# Patient Record
Sex: Female | Born: 1960 | Race: Black or African American | Hispanic: No | Marital: Married | Smoking: Current every day smoker
Health system: Southern US, Community
[De-identification: ages and names within clinical notes are randomized; demographics above are authoritative.]

---

## 2016-05-06 ENCOUNTER — Emergency Department (HOSPITAL_COMMUNITY)

## 2016-05-06 ENCOUNTER — Encounter (HOSPITAL_COMMUNITY): Payer: Self-pay | Admitting: Emergency Medicine

## 2016-05-06 ENCOUNTER — Emergency Department (HOSPITAL_COMMUNITY)
Admission: EM | Admit: 2016-05-06 | Discharge: 2016-05-06 | Disposition: A | Discharge status: Home / Self Care | Attending: Emergency Medicine | Admitting: Emergency Medicine

## 2016-05-06 DIAGNOSIS — R072 Precordial pain: Secondary | ICD-10-CM | POA: Diagnosis not present

## 2016-05-06 DIAGNOSIS — R079 Chest pain, unspecified: Secondary | ICD-10-CM

## 2016-05-06 DIAGNOSIS — F17200 Nicotine dependence, unspecified, uncomplicated: Secondary | ICD-10-CM | POA: Insufficient documentation

## 2016-05-06 LAB — BASIC METABOLIC PANEL
ANION GAP: 8 (ref 5–15)
BUN: 10 mg/dL (ref 6–20)
CHLORIDE: 110 mmol/L (ref 101–111)
CO2: 24 mmol/L (ref 22–32)
CREATININE: 0.68 mg/dL (ref 0.44–1.00)
Calcium: 9.4 mg/dL (ref 8.9–10.3)
GFR calc non Af Amer: >60 mL/min (ref >60)
Glucose, Bld: 109 mg/dL — ABNORMAL HIGH (ref 65–99)
POTASSIUM: 3.8 mmol/L (ref 3.5–5.1)
SODIUM: 142 mmol/L (ref 135–145)

## 2016-05-06 LAB — CBC
HEMATOCRIT: 37.4 % (ref 36.0–46.0)
HEMOGLOBIN: 12.3 g/dL (ref 12.0–15.0)
MCH: 28.1 pg (ref 26.0–34.0)
MCHC: 32.9 g/dL (ref 30.0–36.0)
MCV: 85.4 fL (ref 78.0–100.0)
PLATELETS: 256 10*3/uL (ref 150–400)
RBC: 4.38 MIL/uL (ref 3.87–5.11)
RDW: 13.5 % (ref 11.5–15.5)
WBC: 5.6 10*3/uL (ref 4.0–10.5)

## 2016-05-06 LAB — I-STAT TROPONIN, ED
Troponin i, poc: 0 ng/mL (ref 0.00–0.08)
Troponin i, poc: 0 ng/mL (ref 0.00–0.08)

## 2016-05-06 LAB — D-DIMER, QUANTITATIVE (NOT AT ARMC)

## 2016-05-06 NOTE — ED Notes (Signed)
Pt stable, ambulatory, states understanding of discharge instructions 

## 2016-05-06 NOTE — ED Triage Notes (Signed)
Pt sts chest tightness and SOB starting last night

## 2016-05-06 NOTE — ED Provider Notes (Signed)
[facility]-EMERGENCY DEPT Provider Note   CSN: [id number] Arrival date & time: 05/06/16  1601     History   Chief Complaint Chief Complaint  Patient presents with  . Chest Pain    HPI Anne Lopez is a 56 y.o. female.  HPI  Pt sts chest tightness and SOB starting last night   Patient states that she is a healthy runner. She typically spaces chest pain about twice a month and does not think much about it. In the past she's been diagnosed with reflux and has been evaluated in the hospital in the past for such, evaluation has been negative. She recently had a physical and everything was negative. She has no medical conditions. She eats very healthy and exercises daily. In fact, she is training for a 5K and has experienced chest pain at times but is not worse with the exertion. She did note that chest pain started yesterday and was worse with movement. Not with exertion, did radiate slightly to the right arm. It felt sharp. Initially told triage that she felt like the short of breath but she denies this currently. She states she has not been short of breath. Denies history of pulmonary embolism, blood clots, recent immobilization, recent surgery, birth control. Her pain is not pleuritic. No hemoptysis. Denies radiation to the back, hypertension, tearing pain.  No prior records, ekg  History reviewed. No pertinent past medical history.  There are no active problems to display for this patient.   History reviewed. No pertinent surgical history.  OB History    No data available       Home Medications    Prior to Admission medications   Medication Sig Start Date End Date Taking? Authorizing Provider  cholecalciferol (VITAMIN D) 1000 units tablet Take 1,000 Units by mouth daily.   Yes Historical Provider, MD  EPINEPHrine (EPIPEN 2-PAK) 0.3 mg/0.3 mL IJ SOAJ injection Inject 0.3 mg into the muscle once as needed (severe allergic reaction).   Yes Historical Provider, MD  Magnesium 250 MG  TABS Take 500 mg by mouth daily.   Yes Historical Provider, MD  Magnesium Sulfate, Laxative, (EPSOM SALT PO) Take 5 mLs by mouth daily. Mix in juice and drink   Yes Historical Provider, MD  Multiple Vitamins-Minerals (HAIR/SKIN/NAILS/BIOTIN) TABS Take 1 tablet by mouth daily.   Yes Historical Provider, MD  Prenatal Vit-Fe Fumarate-FA (PRENATAL MULTIVITAMIN) TABS tablet Take 1 tablet by mouth daily at 12 noon.   Yes Historical Provider, MD  vitamin C (ASCORBIC ACID) 250 MG tablet Take 500 mg by mouth daily.   Yes Historical Provider, MD    Family History History reviewed. No pertinent family history.  Social History Social History  Substance Use Topics  . Smoking status: Current Every Day Smoker  . Smokeless tobacco: Never Used  . Alcohol use No     Allergies   Penicillins and Shellfish allergy   Review of Systems Review of Systems  Constitutional: Negative for fever.  Allergic/Immunologic: Negative for immunocompromised state.  All other systems reviewed and are negative.    Physical Exam Updated Vital Signs BP 122/81 (BP Location: Right Arm)   Pulse 75   Temp 98.7 F (37.1 C)   Resp 22   SpO2 99%   Physical Exam  Constitutional: She appears well-developed and well-nourished. No distress.  HENT:  Head: Normocephalic and atraumatic.  Eyes: Conjunctivae are normal. Right eye exhibits no discharge. Left eye exhibits no discharge.  Neck: Normal range of motion. Neck supple. No JVD  present.  Cardiovascular: Normal rate, regular rhythm, normal heart sounds and intact distal pulses.   No murmur heard. Pulmonary/Chest: Effort normal and breath sounds normal. No respiratory distress. She exhibits tenderness (anterior substernal, moderate).  Abdominal: Soft. Bowel sounds are normal. She exhibits no distension and no mass. There is no tenderness. There is no rebound and no guarding.  Musculoskeletal: She exhibits no edema (no asymmetry).  Neurological: She is alert.  Skin:  Skin is warm. No rash noted.  Psychiatric: She has a normal mood and affect.  Nursing note and vitals reviewed.    ED Treatments / Results  Labs (all labs ordered are listed, but only abnormal results are displayed) Labs Reviewed  BASIC METABOLIC PANEL - Abnormal; Notable for the following:       Result Value   Glucose, Bld 109 (*)    All other components within normal limits  CBC  D-DIMER, QUANTITATIVE (NOT AT [facility])  I-STAT TROPOININ, ED  Rosezena Sensor, ED    EKG  EKG Interpretation None       Radiology Dg Chest 2 View  Result Date: 05/06/2016 CLINICAL DATA:  Chest pain, pressure since yesterday EXAM: CHEST  2 VIEW COMPARISON:  None. FINDINGS: The heart size and mediastinal contours are within normal limits. Both lungs are clear. The visualized skeletal structures are unremarkable. IMPRESSION: No active cardiopulmonary disease. Electronically Signed   By: Elige Ko   On: 05/06/2016 16:54    Procedures Procedures (including critical care time)  Medications Ordered in ED Medications - No data to display   Initial Impression / Assessment and Plan / ED Course  I have reviewed the triage vital signs and the nursing notes.  Pertinent labs & imaging results that were available during my care of the patient were reviewed by me and considered in my medical decision making (see chart for details).    EKG NSR, negative for ischemic changes Patient has a here score of less than 3. She has no risk factors for coronary artery disease except for her age. Her chest pain story is extremely atypical for ACS.  EKG is completely negative. Patient has been working out more given an anticipated 5k she is anticipating. Suspect the pain is secondary to muscle skeletal etiology No pulmonary embolism as d-dimer is negative and patient is overall low risk.  Doubt dissection as patient is not hypertensive, pain is not tearing, pain is not severe, pain does not radiate to the back  Delta  troponins negative, less otherwise unremarkable  Chest x-ray unremarkable Discussed with patient strict return precautions, likely muscle skeletal etiology and patient verbalized understanding and agreement with plan  Patient discharged in good condition with follow-up with primary care provider    Final Clinical Impressions(s) / ED Diagnoses   Final diagnoses:  Chest pain, unspecified type    New Prescriptions Discharge Medication List as of 05/06/2016 10:00 PM       Sidney Ace, MD 05/07/16 9604    Gerhard Munch, MD 05/09/16 1205

## 2018-01-14 IMAGING — DX DG CHEST 2V
2 series · 2 of 2 positions shown · non-contrast
Comparison: None.

CLINICAL DATA: Chest pain, pressure since yesterday

EXAM:
CHEST  2 VIEW

[chest pa]
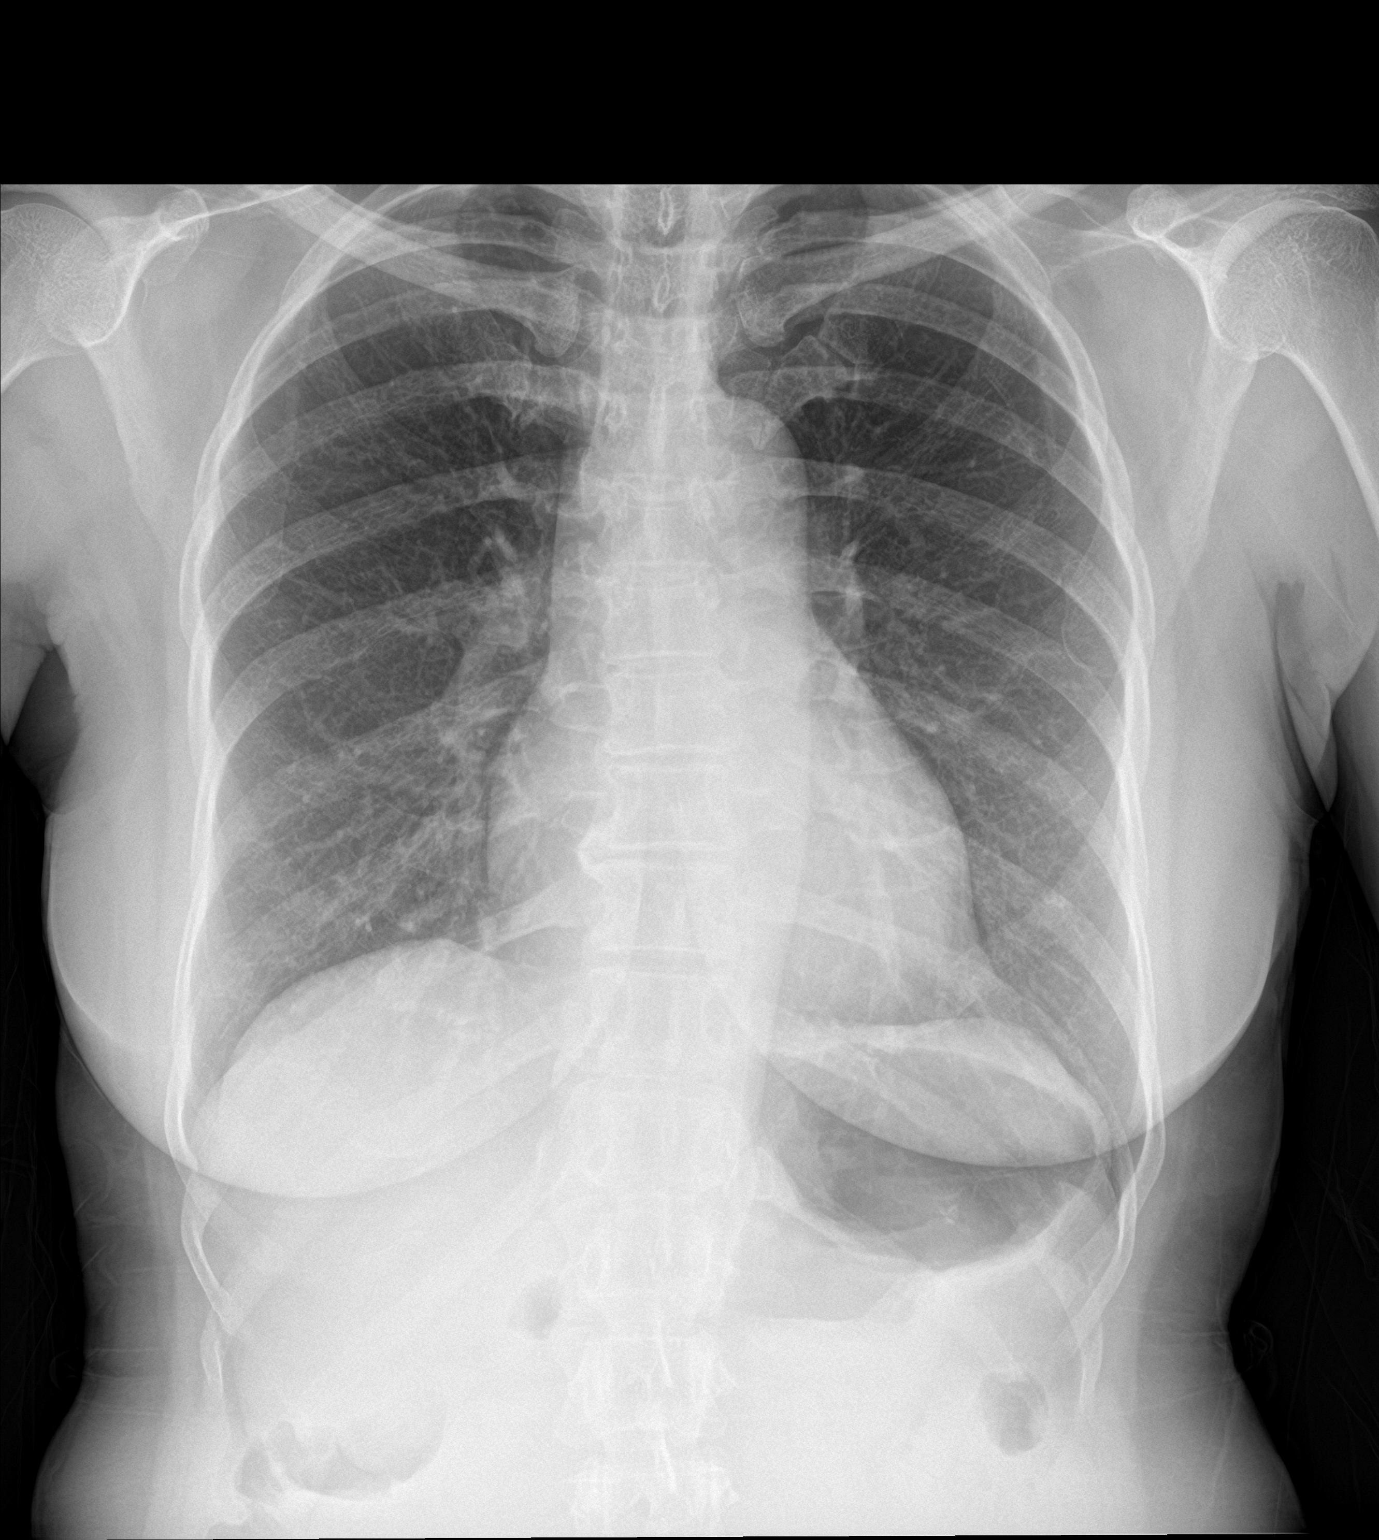

[chest lat]
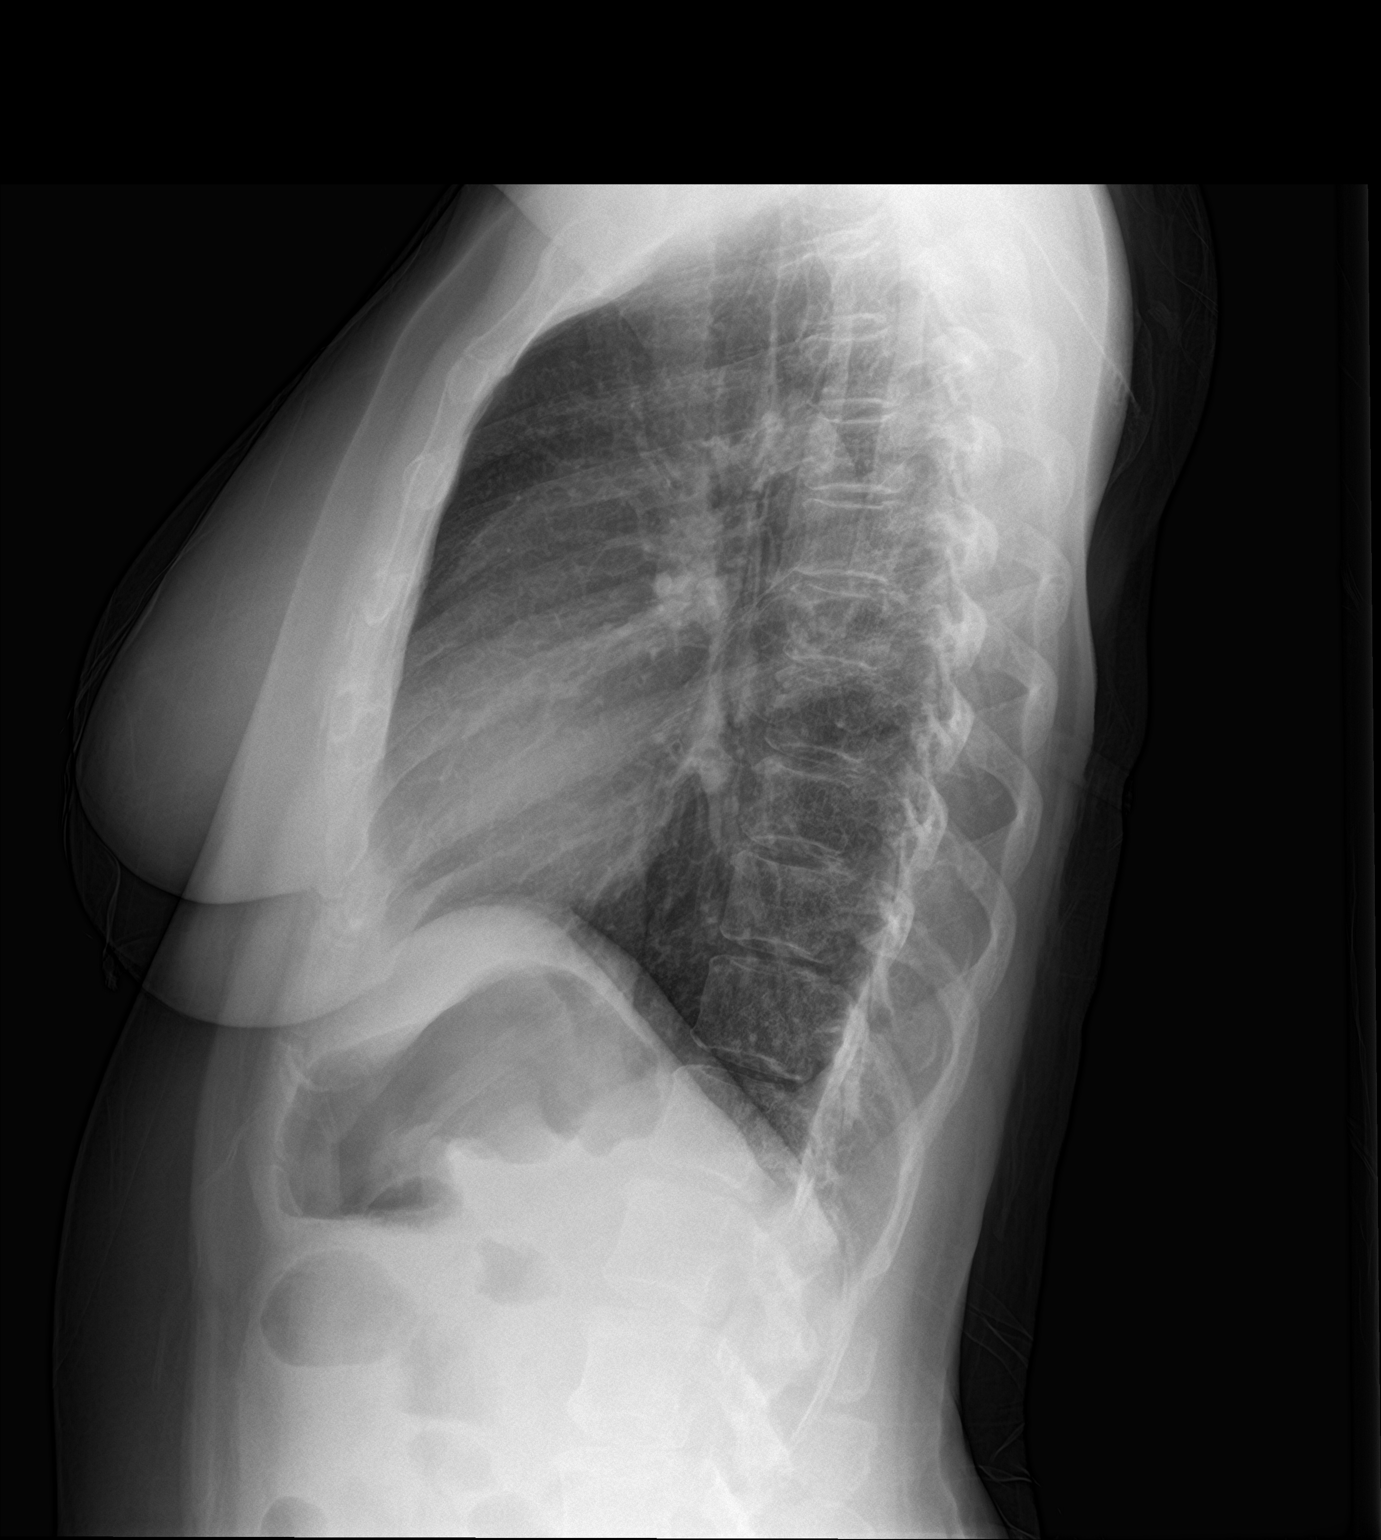

[2 of 2 positions shown; findings below may reference images not displayed]

FINDINGS: The heart size and mediastinal contours are within normal limits.
Both lungs are clear. The visualized skeletal structures are
unremarkable.
IMPRESSION: No active cardiopulmonary disease.

## 2019-02-11 ENCOUNTER — Other Ambulatory Visit: Payer: Self-pay | Admitting: Cardiology

## 2019-02-11 DIAGNOSIS — Z20822 Contact with and (suspected) exposure to covid-19: Secondary | ICD-10-CM

## 2019-02-15 LAB — NOVEL CORONAVIRUS, NAA: SARS-CoV-2, NAA: NOT DETECTED

## 2019-05-24 ENCOUNTER — Ambulatory Visit: Attending: Internal Medicine

## 2019-05-24 DIAGNOSIS — Z23 Encounter for immunization: Secondary | ICD-10-CM

## 2019-05-24 NOTE — Progress Notes (Signed)
   Covid-19 Vaccination Clinic  Name:  Delenn Ahn    MRN: [id number] DOB: 1960/08/16  05/24/2019  Ms. Misko was observed post Covid-19 immunization for 15 minutes without incidence. She was provided with Vaccine Information Sheet and instruction to access the V-Safe system.   Ms. Coppess was instructed to call 911 with any severe reactions post vaccine: Marland Kitchen Difficulty breathing  . Swelling of your face and throat  . A fast heartbeat  . A bad rash all over your body  . Dizziness and weakness    Immunizations Administered    Name Date Dose VIS Date Route   Pfizer COVID-19 Vaccine 05/24/2019  3:33 PM 0.3 mL 03/05/2019 Intramuscular   Manufacturer: [employer], [employer]   Lot: [id number]   NDC: [address]-6

## 2019-06-16 ENCOUNTER — Ambulatory Visit: Attending: Internal Medicine

## 2019-06-16 DIAGNOSIS — Z23 Encounter for immunization: Secondary | ICD-10-CM

## 2019-06-16 NOTE — Progress Notes (Signed)
   Covid-19 Vaccination Clinic  Name:  Noreta Kue    MRN: [id number] DOB: 21-Dec-1960  06/16/2019  Ms. Cordoba was observed post Covid-19 immunization for 15 minutes without incident. She was provided with Vaccine Information Sheet and instruction to access the V-Safe system.   Ms. Templeman was instructed to call 911 with any severe reactions post vaccine: Marland Kitchen Difficulty breathing  . Swelling of face and throat  . A fast heartbeat  . A bad rash all over body  . Dizziness and weakness   Immunizations Administered    Name Date Dose VIS Date Route   Pfizer COVID-19 Vaccine 06/16/2019  3:03 PM 0.3 mL 03/05/2019 Intramuscular   Manufacturer: [employer], [employer]   Lot: [id number]   NDC: [address]-6

## 2024-04-20 ENCOUNTER — Ambulatory Visit: Admitting: Physician Assistant

## 2024-04-27 ENCOUNTER — Ambulatory Visit: Admitting: Physician Assistant

## 2024-12-14 ENCOUNTER — Ambulatory Visit: Admitting: Physician Assistant
